# Patient Record
Sex: Male | Born: 1975 | Race: Black or African American | Hispanic: No | Marital: Single
Health system: Southern US, Community
[De-identification: ages and names within clinical notes are randomized; demographics above are authoritative.]

---

## 2005-02-20 ENCOUNTER — Emergency Department (HOSPITAL_COMMUNITY): Admission: EM | Admit: 2005-02-20 | Discharge: 2005-02-20 | Payer: Self-pay | Admitting: Emergency Medicine

## 2005-10-04 ENCOUNTER — Emergency Department (HOSPITAL_COMMUNITY): Admission: EM | Admit: 2005-10-04 | Discharge: 2005-10-04 | Payer: Self-pay | Admitting: Emergency Medicine

## 2007-07-03 ENCOUNTER — Emergency Department (HOSPITAL_COMMUNITY): Admission: EM | Admit: 2007-07-03 | Discharge: 2007-07-03 | Payer: Self-pay | Admitting: Emergency Medicine

## 2011-07-14 ENCOUNTER — Other Ambulatory Visit: Payer: Self-pay | Admitting: Specialist

## 2011-07-14 DIAGNOSIS — R1011 Right upper quadrant pain: Secondary | ICD-10-CM

## 2011-07-18 ENCOUNTER — Other Ambulatory Visit: Payer: Self-pay

## 2011-07-25 ENCOUNTER — Other Ambulatory Visit: Payer: Self-pay

## 2011-08-08 ENCOUNTER — Other Ambulatory Visit: Payer: Self-pay

## 2014-02-16 ENCOUNTER — Other Ambulatory Visit: Payer: Self-pay | Admitting: Internal Medicine

## 2014-02-16 ENCOUNTER — Ambulatory Visit
Admission: RE | Admit: 2014-02-16 | Discharge: 2014-02-16 | Disposition: A | Discharge status: Home / Self Care | Payer: Managed Care, Other (non HMO) | Source: Ambulatory Visit | Attending: Internal Medicine | Admitting: Internal Medicine

## 2014-02-16 DIAGNOSIS — R109 Unspecified abdominal pain: Secondary | ICD-10-CM

## 2017-01-15 ENCOUNTER — Ambulatory Visit: Payer: Self-pay | Admitting: Family Medicine

## 2017-12-10 ENCOUNTER — Encounter: Payer: Self-pay | Admitting: Hematology

## 2017-12-10 ENCOUNTER — Telehealth: Payer: Self-pay | Admitting: Hematology

## 2017-12-10 NOTE — Telephone Encounter (Signed)
Received a new referral from Dr. Julio Sickssei Bonsu for leukopenia. Pt has been scheduled to see D. Kale on 12/3 at 1pm. Letter mailed to the pt and faxed to the referring to notify the pt.

## 2017-12-25 ENCOUNTER — Encounter: Payer: Managed Care, Other (non HMO) | Admitting: Hematology

## 2021-03-21 ENCOUNTER — Ambulatory Visit (INDEPENDENT_AMBULATORY_CARE_PROVIDER_SITE_OTHER): Payer: Managed Care, Other (non HMO) | Admitting: Family Medicine

## 2021-03-21 VITALS — BP 100/60 | Ht 69.0 in | Wt 136.0 lb

## 2021-03-21 DIAGNOSIS — S43431A Superior glenoid labrum lesion of right shoulder, initial encounter: Secondary | ICD-10-CM | POA: Diagnosis not present

## 2021-03-21 DIAGNOSIS — M7582 Other shoulder lesions, left shoulder: Secondary | ICD-10-CM | POA: Insufficient documentation

## 2021-03-21 DIAGNOSIS — S43002A Unspecified subluxation of left shoulder joint, initial encounter: Secondary | ICD-10-CM | POA: Insufficient documentation

## 2021-03-21 DIAGNOSIS — M7551 Bursitis of right shoulder: Secondary | ICD-10-CM | POA: Insufficient documentation
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
## 2021-03-21 NOTE — Patient Instructions (Signed)
Nice to meet you Please try heat and ice  Please try the exercises  Please call [phone] to see if they are in network and to schedule   Please send me a message in MyChart with any questions or updates.  We'll setup a virtual visit once the MRI is resulted.   --Dr. Jordan Likes
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
## 2021-04-01 ENCOUNTER — Telehealth: Payer: Self-pay | Admitting: Family Medicine

## 2021-04-01 ENCOUNTER — Other Ambulatory Visit: Payer: Managed Care, Other (non HMO)

## 2021-04-01 NOTE — Telephone Encounter (Signed)
Pt called says his MRIs were cancelled per [facility], per [employer] they need addt'l medical inform from provider( [employer] says sent a fax to office). ? ?--Forwarding message to med asst for review. ? ?--glh ?

## 2021-04-01 NOTE — Telephone Encounter (Signed)
Pt informed of below.  

## 2021-04-01 NOTE — Telephone Encounter (Signed)
[facility] [phone] ?I called [facility] to check status on Left shoulder MRI w/o contrast and Right shoulder w/o contrast precert. They have received our clinical notes. Review is still pending, but they will contact us with their decision in 24-48 hours.  ?

## 2021-04-21 ENCOUNTER — Ambulatory Visit
Admission: RE | Admit: 2021-04-21 | Discharge: 2021-04-21 | Disposition: A | Discharge status: Home / Self Care | Payer: Managed Care, Other (non HMO) | Source: Ambulatory Visit | Attending: Family Medicine | Admitting: Family Medicine

## 2021-04-21 DIAGNOSIS — S43002A Unspecified subluxation of left shoulder joint, initial encounter: Secondary | ICD-10-CM

## 2021-04-21 DIAGNOSIS — S43431A Superior glenoid labrum lesion of right shoulder, initial encounter: Secondary | ICD-10-CM

## 2021-04-25 ENCOUNTER — Telehealth (INDEPENDENT_AMBULATORY_CARE_PROVIDER_SITE_OTHER): Payer: Managed Care, Other (non HMO) | Admitting: Family Medicine

## 2021-04-25 DIAGNOSIS — M7582 Other shoulder lesions, left shoulder: Secondary | ICD-10-CM | POA: Diagnosis not present

## 2021-04-25 DIAGNOSIS — M7551 Bursitis of right shoulder: Secondary | ICD-10-CM | POA: Diagnosis not present
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
## 2021-05-09 ENCOUNTER — Ambulatory Visit: Payer: Commercial Managed Care - HMO | Attending: Family Medicine | Admitting: Physical Therapy

## 2021-05-09 ENCOUNTER — Ambulatory Visit: Payer: Managed Care, Other (non HMO) | Admitting: Physical Therapy

## 2021-05-09 ENCOUNTER — Encounter: Payer: Self-pay | Admitting: Physical Therapy

## 2021-05-09 DIAGNOSIS — M7551 Bursitis of right shoulder: Secondary | ICD-10-CM | POA: Diagnosis present

## 2021-05-09 DIAGNOSIS — M25511 Pain in right shoulder: Secondary | ICD-10-CM | POA: Diagnosis not present

## 2021-05-09 DIAGNOSIS — M25512 Pain in left shoulder: Secondary | ICD-10-CM | POA: Diagnosis not present

## 2021-05-09 DIAGNOSIS — R252 Cramp and spasm: Secondary | ICD-10-CM

## 2021-05-09 DIAGNOSIS — M6281 Muscle weakness (generalized): Secondary | ICD-10-CM | POA: Diagnosis not present

## 2021-05-09 DIAGNOSIS — M7582 Other shoulder lesions, left shoulder: Secondary | ICD-10-CM | POA: Diagnosis not present
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
## 2021-05-09 NOTE — Patient Instructions (Signed)
Access Code: [email or web address] ?URL: [email or web address] ?Date: 05/09/2021 ?Prepared by: Almyra Free ? ?Exercises ?- Doorway Pec Stretch at 90 Degrees Abduction  - 1 x daily - 7 x weekly - 1 sets - 3 reps - 30-60 seconds hold ?- Doorway Pec Stretch at 60 Elevation  - 1 x daily - 7 x weekly - 3 sets - 10 reps ?- Seated Scapular Retraction  - 1 x daily - 7 x weekly - 1-3 sets - 10 reps - 2-3 sec hold ?- Shoulder External Rotation with Anchored Resistance (Mirrored)  - 1 x daily - 4 x weekly - 1-3 sets - 10 reps - 3 sec hold ?- Shoulder Internal Rotation with Resistance  - 1 x daily - 4 x weekly - 1-3 sets - 10 reps - 3 sec hold ?- Standing Bilateral Low Shoulder Row with Anchored Resistance  - 1 x daily - 4 x weekly - 1-3 sets - 10 reps - 3 sec hold ?- Shoulder extension with resistance - Neutral  - 1 x daily - 4 x weekly - 1-3 sets - 10 reps - 3 sec hold ? ?Patient Education ?- Office Posture ?- Forward Head Posture ?

## 2021-05-20 ENCOUNTER — Ambulatory Visit: Payer: Commercial Managed Care - HMO | Admitting: Physical Therapy

## 2021-05-20 DIAGNOSIS — M25512 Pain in left shoulder: Secondary | ICD-10-CM

## 2021-05-20 DIAGNOSIS — M6281 Muscle weakness (generalized): Secondary | ICD-10-CM

## 2021-05-20 DIAGNOSIS — M25511 Pain in right shoulder: Secondary | ICD-10-CM

## 2021-05-20 DIAGNOSIS — M7551 Bursitis of right shoulder: Secondary | ICD-10-CM | POA: Diagnosis not present

## 2021-05-20 DIAGNOSIS — R252 Cramp and spasm: Secondary | ICD-10-CM

## 2021-05-20 NOTE — Therapy (Signed)
[place] ?[facility] ?[address] [place] [address] ?[place], [place], [address] ?Phone: [phone]   Fax:  [phone] ? ?Physical Therapy Treatment ? ?Patient Details  ?Name: Jonathan Johnson ?MRN: [id number] ?Date of Birth: 08/09/1975 ?Referring Provider (PT): Clare Gandy MD ? ? ?Encounter Date: 05/20/2021 ? ? PT End of Session - 05/20/21 1224   ? ? Visit Number 2   ? Date for PT Re-Evaluation 07/04/21   ? PT Start Time 1142   ? PT Stop Time 1223   ? PT Time Calculation (min) 41 min   ? Activity Tolerance Patient tolerated treatment well   ? Behavior During Therapy [facility] for tasks assessed/performed   ? ?  ?  ? ?  ? ? ?No past medical history on file. ? ?No past surgical history on file. ? ?There were no vitals filed for this visit. ? ? Subjective Assessment - 05/20/21 1147   ? ? Subjective Pt states he mostly feels pain with trying to lift heavy objects. He states the pain is not constant and does not last "too long"   ? Patient Stated Goals decrease pain   ? Currently in Pain? No/denies   ? ?  ?  ? ?  ? ? ? ? ? [facility] PT Assessment - 05/20/21 0001   ? ?  ? Assessment  ? Medical Diagnosis Rt subacromial bursitis; Lt RC tendinitis   ? Referring Provider (PT) Clare Gandy MD   ? Onset Date/Surgical Date 01/06/21   ? Hand Dominance Right   ? Next MD Visit none scheduled   ? Prior Therapy no   ?  ? AROM  ? Overall AROM Comments IR 70 active and passive   ? ?  ?  ? ?  ? ? ? ? ? ? ? ? ? ? ? ? ? ? ? ? [facility] Adult PT Treatment/Exercise - 05/20/21 0001   ? ?  ? Exercises  ? Exercises Shoulder   ?  ? Shoulder Exercises: Standing  ? External Rotation 20 reps   ? Theraband Level (Shoulder External Rotation) Level 2 (Red)   ? External Rotation Limitations then bilat ER with red TB for scap squeeze x 20   ? Internal Rotation 20 reps   ? Theraband Level (Shoulder Internal Rotation) Level 2 (Red)   ? Extension 20 reps   ? Theraband Level (Shoulder Extension) Level 3 (Green)   ? Row 20 reps   ?  Theraband Level (Shoulder Row) Level 3 (Green)   ?  ? Shoulder Exercises: ROM/Strengthening  ? UBE (Upper Arm Bike) L2 x 2 min alt fwd/bkwd   ?  ? Shoulder Exercises: Stretch  ? Other Shoulder Stretches doorway stretch 10sec x 3   cues for technique  ?  ? Manual Therapy  ? Manual Therapy Soft tissue mobilization   ? Soft tissue mobilization STM UT, Rt deltoids to decrease pain and spasticity   ? ?  ?  ? ?  ? ? ? ? ? ? ? ? ? ? ? ? PT Short Term Goals - 05/09/21 1538   ? ?  ? PT SHORT TERM GOAL #1  ? Title ind with initial HEP   ? Time 2   ? Period Weeks   ? Status New   ? Target Date 05/23/21   ? ?  ?  ? ?  ? ? ? ? PT Long Term Goals - 05/09/21 1539   ? ?  ? PT LONG TERM GOAL #1  ?  Title Ind with HEP and progression   ? Time 8   ? Period Weeks   ? Status New   ? Target Date 07/04/21   ?  ? PT LONG TERM GOAL #2  ? Title Patient able to perform ADLS including carrying and lifting without shoulder pain.   ? Time 8   ? Period Weeks   ? Status New   ?  ? PT LONG TERM GOAL #3  ? Title Patient to demonstrate 5/5 shoulder strength   ? Time 8   ? Period Weeks   ? Status New   ?  ? PT LONG TERM GOAL #4  ? Title Pt able to drive without pain   ? Time 8   ? Period Weeks   ? Status New   ?  ? PT LONG TERM GOAL #5  ? Title improved FOTO to 77 from 65 showing functional improvement   ? Time 8   ? Period Weeks   ? Status New   ? ?  ?  ? ?  ? ? ? ? ? ? ? ? Plan - 05/20/21 1225   ? ? Clinical Impression Statement Pt with good tolerance to exercises. Requires cues for form with doorway stretch and cues to reduce upper trap compensation during postural strengthening. Good response to manual work   ? PT Next Visit Plan postural strengthening, manual as indicated for Rt shoulder   ? PT Home Exercise Plan [email or web address]   ? Consulted and Agree with Plan of Care Patient   ? ?  ?  ? ?  ? ? ?Patient will benefit from skilled therapeutic intervention in order to improve the following deficits and impairments:    ? ?Visit Diagnosis: ?Acute pain of  right shoulder ? ?Acute pain of left shoulder ? ?Muscle weakness (generalized) ? ?Cramp and spasm ? ? ? ? ?Problem List ?Patient Active Problem List  ? Diagnosis Date Noted  ? Subacromial bursitis of right shoulder joint 03/21/2021  ? Rotator cuff tendinitis, left 03/21/2021  ? ? ?Sydney Hasten, PT ?05/20/2021, 12:26 PM ? ?[place] ?[facility] ?[address] [place] [address] ?[place], [place], [address] ?Phone: [phone]   Fax:  [phone] ? ?Name: Zahmir Viscuso ?MRN: [id number] ?Date of Birth: 12-07-1975 ? ? ? ?

## 2021-05-27 ENCOUNTER — Encounter: Payer: Self-pay | Admitting: Rehabilitative and Restorative Service Providers"

## 2021-05-27 ENCOUNTER — Ambulatory Visit
Payer: Commercial Managed Care - HMO | Attending: Family Medicine | Admitting: Rehabilitative and Restorative Service Providers"

## 2021-05-27 DIAGNOSIS — M25512 Pain in left shoulder: Secondary | ICD-10-CM | POA: Insufficient documentation

## 2021-05-27 DIAGNOSIS — M25511 Pain in right shoulder: Secondary | ICD-10-CM | POA: Diagnosis present

## 2021-05-27 DIAGNOSIS — R252 Cramp and spasm: Secondary | ICD-10-CM | POA: Insufficient documentation

## 2021-05-27 DIAGNOSIS — M6281 Muscle weakness (generalized): Secondary | ICD-10-CM | POA: Diagnosis present

## 2021-05-27 NOTE — Therapy (Signed)
[place] ?[facility] ?[address] [place] [address] ?[place], [place], [address] ?Phone: [phone]   Fax:  [phone] ? ?Physical Therapy Treatment ? ?Patient Details  ?Name: Jonathan Johnson ?MRN: [id number] ?Date of Birth: 1976/01/20 ?Referring Provider (PT): Clare Gandy MD ? ? ?Encounter Date: 05/27/2021 ? ? PT End of Session - 05/27/21 1103   ? ? Visit Number 3   ? Number of Visits 8   ? Date for PT Re-Evaluation 07/04/21   ? Authorization Type cigna   ? PT Start Time 1102   ? PT Stop Time 1148   ? PT Time Calculation (min) 46 min   ? Activity Tolerance Patient tolerated treatment well   ? ?  ?  ? ?  ? ? ?History reviewed. No pertinent past medical history. ? ?History reviewed. No pertinent surgical history. ? ?There were no vitals filed for this visit. ? ? Subjective Assessment - 05/27/21 1104   ? ? Subjective Patient reports that he has not had shoulder pain since he was last here. He has been workiong on his exercises at home. Works at a [employer] all day. Discussed importance of consistent stretching and frequen breaks to change posture when working. Patient feels he will be ready for discharge after next visit.   ? Currently in Pain? No/denies   ? ?  ?  ? ?  ? ? ? ? ? [facility] PT Assessment - 05/27/21 0001   ? ?  ? Assessment  ? Medical Diagnosis Rt subacromial bursitis; Lt RC tendinitis   ? Referring Provider (PT) Clare Gandy MD   ? Onset Date/Surgical Date 01/06/21   ? Hand Dominance Right   ? Next MD Visit none scheduled   ? Prior Therapy no   ?  ? Palpation  ? Palpation comment minimal tenderness; continued tightness noted right > Lt  UT, infraspinatus, deltoids, lats and pectoralis   ? ?  ?  ? ?  ? ? ? ? ? ? ? ? ? ? ? ? ? ? ? ? [facility] Adult PT Treatment/Exercise - 05/27/21 0001   ? ?  ? Shoulder Exercises: Standing  ? External Rotation Strengthening;Right;Left;10 reps;Theraband   ? Theraband Level (Shoulder External Rotation) Level 2 (Red);Level 3 (Green)   ?  Internal Rotation Strengthening;Right;Left;10 reps;Theraband   ? Theraband Level (Shoulder Internal Rotation) Level 3 (Green)   ? Extension Strengthening;Both;20 reps;Theraband   ? Theraband Level (Shoulder Extension) Level 3 (Green)   3 sec hold  ? Row Strengthening;Both;20 reps;Theraband   ? Theraband Level (Shoulder Row) Level 3 (Green)   3 sec hold  ? Retraction Strengthening;Both;10 reps;Theraband   ? Theraband Level (Shoulder Retraction) Level 2 (Red)   ?  ? Shoulder Exercises: ROM/Strengthening  ? UBE (Upper Arm Bike) L2 x 5 min alt fwd/bkwd   ?  ? Shoulder Exercises: Stretch  ? Wall Stretch - ABduction Limitations horizontal abduction stretch hand resting on wall 30 sec x 3 each side   ? Other Shoulder Stretches doorway three positions adding lower position for biceps stretch 30 sec x 3 reps each   ?  ? Manual Therapy  ? Manual therapy comments pt supine with bolster   ? Soft tissue mobilization deep tissue work through Occupational psychologist; upper traps   ? Passive ROM PROM bilat shoulders into flexion   ? ?  ?  ? ?  ? ? ? ? ? ? ? ? ? ? PT Education - 05/27/21 1139   ? ?  Education Details HEP   ? Person(s) Educated Patient   ? Methods Explanation;Demonstration;Tactile cues;Verbal cues;Handout   ? Comprehension Verbalized understanding;Returned demonstration;Verbal cues required;Tactile cues required   ? ?  ?  ? ?  ? ? ? PT Short Term Goals - 05/09/21 1538   ? ?  ? PT SHORT TERM GOAL #1  ? Title ind with initial HEP   ? Time 2   ? Period Weeks   ? Status New   ? Target Date 05/23/21   ? ?  ?  ? ?  ? ? ? ? PT Long Term Goals - 05/09/21 1539   ? ?  ? PT LONG TERM GOAL #1  ? Title Ind with HEP and progression   ? Time 8   ? Period Weeks   ? Status New   ? Target Date 07/04/21   ?  ? PT LONG TERM GOAL #2  ? Title Patient able to perform ADLS including carrying and lifting without shoulder pain.   ? Time 8   ? Period Weeks   ? Status New   ?  ? PT LONG TERM GOAL #3  ? Title Patient to demonstrate 5/5  shoulder strength   ? Time 8   ? Period Weeks   ? Status New   ?  ? PT LONG TERM GOAL #4  ? Title Pt able to drive without pain   ? Time 8   ? Period Weeks   ? Status New   ?  ? PT LONG TERM GOAL #5  ? Title improved FOTO to 77 from 65 showing functional improvement   ? Time 8   ? Period Weeks   ? Status New   ? ?  ?  ? ?  ? ? ? ? ? ? ? ? Plan - 05/27/21 1113   ? ? Clinical Impression Statement Good improvement with treatment and home program. REviewed and progressed exercises focus on posture and alignment; stretching; posterior shoudler girdle strengthening. Discussed importance of frequent breaks during the work day and good ergonomics at work.   ? Rehab Potential Excellent   ? PT Frequency 1x / week   ? PT Duration 8 weeks   ? PT Treatment/Interventions ADLs/Self Care Home Management;Cryotherapy;Electrical Stimulation;Iontophoresis 4mg /ml Dexamethasone;Moist Heat;Neuromuscular re-education;Therapeutic exercise;Therapeutic activities;Patient/family education;Manual techniques;Dry needling;Taping   ? PT Next Visit Plan postural strengthening, manual as indicated for Rt shoulder   ? PT Home Exercise Plan [email or web address]   ? ?  ?  ? ?  ? ? ?Patient will benefit from skilled therapeutic intervention in order to improve the following deficits and impairments:    ? ?Visit Diagnosis: ?Acute pain of right shoulder ? ?Acute pain of left shoulder ? ?Muscle weakness (generalized) ? ?Cramp and spasm ? ? ? ? ?Problem List ?Patient Active Problem List  ? Diagnosis Date Noted  ? Subacromial bursitis of right shoulder joint 03/21/2021  ? Rotator cuff tendinitis, left 03/21/2021  ? ? ?03/23/2021, PT, MPH  ?05/27/2021, 11:54 AM ? ?[place] ?[facility] ?[address] ?[place], [place], [place] ?Phone: [phone]   Fax:  [phone] ? ?Name: Jonathan Johnson ?MRN: Jonathan Johnson ?Date of Birth: January 25, 1975 ? ? ? ?

## 2021-05-27 NOTE — Patient Instructions (Signed)
Access Code: [email or web address] ?URL: [email or web address] ?Date: 05/27/2021 ?Prepared by: Corlis Leak ? ?Exercises ?- Doorway Pec Stretch at 90 Degrees Abduction  - 1 x daily - 7 x weekly - 1 sets - 3 reps - 30-60 seconds hold ?- Doorway Pec Stretch at 60 Elevation  - 1 x daily - 7 x weekly - 3 sets - 10 reps ?- Seated Scapular Retraction  - 1 x daily - 7 x weekly - 1-3 sets - 10 reps - 2-3 sec hold ?- Shoulder External Rotation with Anchored Resistance (Mirrored)  - 1 x daily - 4 x weekly - 1-3 sets - 10 reps - 3 sec hold ?- Shoulder Internal Rotation with Resistance  - 1 x daily - 4 x weekly - 1-3 sets - 10 reps - 3 sec hold ?- Standing Bilateral Low Shoulder Row with Anchored Resistance  - 1 x daily - 4 x weekly - 1-3 sets - 10 reps - 3 sec hold ?- Shoulder extension with resistance - Neutral  - 1 x daily - 4 x weekly - 1-3 sets - 10 reps - 3 sec hold ?- Chest and Bicep Stretch - Arms Behind Back  - 2 x daily - 7 x weekly - 1 sets - 3 reps - 30 sec  hold ?- Standing Bicep Stretch at Wall  - 2 x daily - 7 x weekly - 1 sets - 3 reps - 30 sec  hold ?- Hooklying Shoulder T  - 2 x daily - 7 x weekly - 1 sets - 1 reps - 2-5 min  hold ?

## 2021-06-03 ENCOUNTER — Ambulatory Visit: Payer: Commercial Managed Care - HMO | Admitting: Physical Therapy

## 2021-06-03 DIAGNOSIS — R252 Cramp and spasm: Secondary | ICD-10-CM

## 2021-06-03 DIAGNOSIS — M25511 Pain in right shoulder: Secondary | ICD-10-CM | POA: Diagnosis not present

## 2021-06-03 DIAGNOSIS — M6281 Muscle weakness (generalized): Secondary | ICD-10-CM

## 2021-06-03 DIAGNOSIS — M25512 Pain in left shoulder: Secondary | ICD-10-CM

## 2021-06-03 NOTE — Therapy (Addendum)
[facility] [address] [place], [place], [address] Phone: [phone]   Fax:  [phone]  Physical Therapy Treatment PHYSICAL THERAPY DISCHARGE SUMMARY  Visits from Start of Care: 4  Current functional level related to goals / functional outcomes: See progress note for discharge status    Remaining deficits: Unknown    Education / Equipment: HEP   Patient agrees to discharge. Patient goals were partially met. Patient is being discharged due to not returning since the last visit.  Celyn P. Helene Kelp PT, MPH 07/20/21 7:11 AM  Patient Details  Name: Jonathan Johnson MRN: [id number] Date of Birth: [birth year] Referring Provider (PT): Clearance Coots MD   Encounter Date: 06/03/2021   PT End of Session - 06/03/21 1102     Visit Number 4    Number of Visits 8    Date for PT Re-Evaluation 07/04/21    PT Start Time 1025    PT Stop Time 1103    PT Time Calculation (min) 38 min    Activity Tolerance Patient tolerated treatment well    Behavior During Therapy [facility] for tasks assessed/performed             No past medical history on file.  No past surgical history on file.  There were no vitals filed for this visit.   Subjective Assessment - 06/03/21 1027     Subjective Pt states that he has no pain and is feeling better. He has been performing HEP    Patient Stated Goals decrease pain    Currently in Pain? No/denies                [facility] PT Assessment - 06/03/21 0001       Assessment   Medical Diagnosis Rt subacromial bursitis; Lt RC tendinitis    Referring Provider (PT) Clearance Coots MD    Onset Date/Surgical Date 01/06/21    Hand Dominance Right    Next MD Visit none scheduled    Prior Therapy no      Strength   Overall Strength Comments Rt shoulder grossly 4+/5 abd, ext, IR, ER                           [facility] Adult PT Treatment/Exercise - 06/03/21 0001       Shoulder Exercises:  Standing   External Rotation 20 reps    Theraband Level (Shoulder External Rotation) Level 3 (Green)    Internal Rotation 20 reps    Theraband Level (Shoulder Internal Rotation) Level 3 (Green)    Extension Strengthening;Both;20 reps;Theraband    Theraband Level (Shoulder Extension) Level 3 (Green)   3 sec hold   Row Strengthening;Both;20 reps;Theraband    Theraband Level (Shoulder Row) Level 3 (Green)   3 sec hold   Other Standing Exercises scap clocks x 10 red TB    Other Standing Exercises elevated plank with alt shld tap x 10      Shoulder Exercises: ROM/Strengthening   UBE (Upper Arm Bike) L2 x 4 min alt fwd/bkwd      Shoulder Exercises: Stretch   Wall Stretch - ABduction Limitations horizontal abduction stretch hand resting on wall 30 sec x 3 each side    Other Shoulder Stretches doorway three positions adding lower position for biceps stretch 30 sec x 3 reps each                       PT  Short Term Goals - 05/09/21 1538       PT SHORT TERM GOAL #1   Title ind with initial HEP    Time 2    Period Weeks    Status New    Target Date 05/23/21               PT Long Term Goals - 06/03/21 1028       PT LONG TERM GOAL #2   Title Patient able to perform ADLS including carrying and lifting without shoulder pain.    Status Achieved      PT LONG TERM GOAL #3   Title Patient to demonstrate 5/5 shoulder strength    Baseline 4+/5    Status On-going      PT LONG TERM GOAL #4   Title Pt able to drive without pain    Status Achieved                   Plan - 06/03/21 1103     Clinical Impression Statement Pt continues to progress well. Added scapular clock and elevated plank with shoulder taps to improve strenght and stability. He is progressing well towards remaining goals    PT Next Visit Plan d/c? see how it went with no therapy for 3 weeks, progress strength and stability as indicated    PT Home Exercise Plan [email or web address] - added plank wiht alt  shld tap and scap clock (medbridge was down)    Consulted and Agree with Plan of Care Patient             Patient will benefit from skilled therapeutic intervention in order to improve the following deficits and impairments:     Visit Diagnosis: Acute pain of right shoulder  Acute pain of left shoulder  Muscle weakness (generalized)  Cramp and spasm     Problem List Patient Active Problem List   Diagnosis Date Noted   Subacromial bursitis of right shoulder joint 03/21/2021   Rotator cuff tendinitis, left 03/21/2021    Lillia Lengel, PT 06/03/2021, 11:[address] [place], [place], [address] Phone: [phone]   Fax:  [phone]  Name: Jonathan Johnson MRN: [id number] Date of Birth: 1975-03-18

## 2021-06-24 ENCOUNTER — Encounter: Payer: Commercial Managed Care - HMO | Admitting: Physical Therapy

## 2022-05-09 ENCOUNTER — Encounter: Payer: Self-pay | Admitting: *Deleted

## 2022-08-28 DIAGNOSIS — R7303 Prediabetes: Secondary | ICD-10-CM | POA: Diagnosis not present

## 2022-08-28 DIAGNOSIS — K219 Gastro-esophageal reflux disease without esophagitis: Secondary | ICD-10-CM | POA: Diagnosis not present

## 2022-08-28 DIAGNOSIS — B009 Herpesviral infection, unspecified: Secondary | ICD-10-CM | POA: Diagnosis not present
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
## 2022-11-27 DIAGNOSIS — Z13220 Encounter for screening for lipoid disorders: Secondary | ICD-10-CM | POA: Diagnosis not present

## 2022-11-27 DIAGNOSIS — R7303 Prediabetes: Secondary | ICD-10-CM | POA: Diagnosis not present

## 2022-11-27 DIAGNOSIS — K219 Gastro-esophageal reflux disease without esophagitis: Secondary | ICD-10-CM | POA: Diagnosis not present

## 2022-11-27 DIAGNOSIS — B009 Herpesviral infection, unspecified: Secondary | ICD-10-CM | POA: Diagnosis not present

## 2022-11-27 DIAGNOSIS — Z0000 Encounter for general adult medical examination without abnormal findings: Secondary | ICD-10-CM | POA: Diagnosis not present

## 2022-11-27 DIAGNOSIS — Z125 Encounter for screening for malignant neoplasm of prostate: Secondary | ICD-10-CM | POA: Diagnosis not present

## 2023-08-16 DIAGNOSIS — L03317 Cellulitis of buttock: Secondary | ICD-10-CM | POA: Diagnosis not present

## 2023-08-17 ENCOUNTER — Encounter (HOSPITAL_COMMUNITY): Payer: Self-pay

## 2023-08-17 ENCOUNTER — Other Ambulatory Visit: Payer: Self-pay

## 2023-08-17 ENCOUNTER — Emergency Department (HOSPITAL_COMMUNITY)
Admission: EM | Admit: 2023-08-17 | Discharge: 2023-08-17 | Disposition: A | Discharge status: Home / Self Care | Attending: Emergency Medicine | Admitting: Emergency Medicine

## 2023-08-17 DIAGNOSIS — L03317 Cellulitis of buttock: Secondary | ICD-10-CM

## 2023-08-17 MED ORDER — AMOXICILLIN-POT CLAVULANATE 875-125 MG PO TABS: 1.0000 | ORAL_TABLET | Freq: Two times a day (BID) | ORAL | 0 refills | Status: AC

## 2023-08-17 MED ORDER — AMOXICILLIN-POT CLAVULANATE 875-125 MG PO TABS
1.0000 | ORAL_TABLET | Freq: Once | ORAL | Status: AC
  Administered 2023-08-17: 1 via ORAL
  Filled 2023-08-17: qty 1

## 2023-08-17 MED ORDER — CHLORHEXIDINE GLUCONATE 4 % EX SOLN: Freq: Every day | CUTANEOUS | 0 refills | Status: AC | PRN

## 2023-08-17 NOTE — Discharge Instructions (Addendum)
 You have a skin infection of your buttock. Please use the prescribed antibiotic for the entire course. Follow up with your primary care doctor for recheck of the infection in 1-2 weeks. Return to the ER with any worsening pain/ swelling, significant pus-like drainage from the area, fever, or any other new severe symptoms.

## 2023-08-17 NOTE — ED Triage Notes (Signed)
 Patient reports recurrent abscess at right buttocks for several years , denies injury/afebrile .

## 2023-08-17 NOTE — ED Provider Notes (Signed)
 [place] EMERGENCY DEPARTMENT AT [facility] Provider Note   CSN: [id number] Arrival date & time: 08/16/23  2358     Patient presents with: Abscess (Right Buttocks)   Jonathan Johnson is a 48 y.o. male who presents with concern for infection in his R buttock, hx of same intermittently in the past. Has previously had success and resolution after application of warm compresses and drainage of pus. However, last 3 days, pain worsening than typical for him in the past. NO pain with bowel movements, no pain in the genitals, no fevers or chills. NO hx of I&D.   No known medical diagnoses or medications daily.    HPI     Prior to Admission medications   Medication Sig Start Date End Date Taking? Authorizing Provider  amoxicillin-clavulanate (AUGMENTIN) 875-125 MG tablet Take 1 tablet by mouth every 12 (twelve) hours. 08/17/23  Yes Micheline Markes R, PA-C  chlorhexidine (HIBICLENS) 4 % external liquid Apply topically daily as needed. Please use on the area of your buttock infection 08/17/23  Yes Kamariya Blevens R, PA-C    Allergies: Patient has no known allergies.    Review of Systems  Constitutional: Negative.   Gastrointestinal: Negative.   Skin:        ? Abscess     Updated Vital Signs BP (!) 131/96   Pulse 62   Temp 98 F (36.7 C) (Oral)   Resp 18   Ht 5' 9 (1.753 m)   Wt 63.5 kg   SpO2 100%   BMI 20.67 kg/m   Physical Exam Vitals and nursing note reviewed. Exam conducted with a chaperone present (PA student Jonathan Johnson).  Constitutional:      Appearance: He is not ill-appearing or toxic-appearing.  HENT:     Head: Normocephalic and atraumatic.  Eyes:     General: No scleral icterus.       Right eye: No discharge.        Left eye: No discharge.     Conjunctiva/sclera: Conjunctivae normal.  Cardiovascular:     Heart sounds: Normal heart sounds. No murmur heard. Pulmonary:     Effort: Pulmonary effort is normal.  Abdominal:     General: There is no  distension.     Palpations: Abdomen is soft.     Tenderness: There is no abdominal tenderness.  Genitourinary:  Skin:    General: Skin is warm and dry.     Capillary Refill: Capillary refill takes less than 2 seconds.  Neurological:     General: No focal deficit present.     Mental Status: He is alert.  Psychiatric:        Mood and Affect: Mood normal.     (all labs ordered are listed, but only abnormal results are displayed) Labs Reviewed - No data to display  EKG: None  Radiology: No results found.   SABRAUltrasound ED Soft Tissue  Date/Time: 08/17/2023 4:14 AM  Performed by: Bobette Pleasant SAUNDERS, PA-C Authorized by: Bobette Pleasant SAUNDERS, PA-C   Procedure details:    Indications: localization of abscess and evaluate for cellulitis     Transverse view:  Visualized   Longitudinal view:  Visualized   Images: not archived   Location:    Location: buttocks     Side:  Right Findings:     no abscess present    cellulitis present    no foreign body present Comments:     Patient with tiny, subcentimeter pocket of pus, immediately beneath the ruptured pustule  noted on the skin. No area of fluid collection amenable to drainage. Notable cobblestoning, ~1.5 cm deep x 3 cm long in area consistent with palpable induration and TTP.     Medications Ordered in the ED  amoxicillin-clavulanate (AUGMENTIN) 875-125 MG per tablet 1 tablet (1 tablet Oral Given 08/17/23 0345)                                    Medical Decision Making 48 y/o male who presents with concern for R buttock infection.   Mild HTN on intake, VS otherwise normal. Genital and perineal exam unremarkable. Abdominal exam unremarkable, skin exam of the buttock as above.   DDX includes but is not limited to cellulitis, abscess, folliculitis. Perirectal abscess.   Risk OTC drugs. Prescription drug management.   Bedside ultrasound and physical exam as above; clinical picture most consistent with cellulitis of  the R buttock without abscess amenable to drainage. Will d/c with abx and instructions for warm compressess and close outpatient followup with PCP. Strict return precautions given.  Clincial concern for perirectal abscess or emergent underlying condition that would warrant further ED workup or inpatient management is exceedingly low.   [name]  voiced understanding of his medical evaluation and treatment plan. Each of their questions answered to their expressed satisfaction.  Return precautions were given.  Patient is well-appearing, stable, and was discharged in good condition.  This chart was dictated using voice recognition software, Dragon. Despite the best efforts of this provider to proofread and correct errors, errors may still occur which can change documentation meaning.      Final diagnoses:  Cellulitis of buttock, right    ED Discharge Orders          Ordered    chlorhexidine (HIBICLENS) 4 % external liquid  Daily PRN        08/17/23 0339    amoxicillin-clavulanate (AUGMENTIN) 875-125 MG tablet  Every 12 hours        08/17/23 0339               Danasha Melman, Pleasant SAUNDERS, PA-C 08/17/23 0422    Trine Raynell Moder, MD 08/17/23 [phone]

## 2023-08-30 DIAGNOSIS — K219 Gastro-esophageal reflux disease without esophagitis: Secondary | ICD-10-CM | POA: Diagnosis not present

## 2023-08-30 DIAGNOSIS — R7303 Prediabetes: Secondary | ICD-10-CM | POA: Diagnosis not present

## 2023-08-30 DIAGNOSIS — B009 Herpesviral infection, unspecified: Secondary | ICD-10-CM | POA: Diagnosis not present

## 2023-08-30 DIAGNOSIS — Z67A1 Duffy null: Secondary | ICD-10-CM | POA: Diagnosis not present
